# Patient Record
Sex: Female | Born: 1980
Health system: Southern US, Community
[De-identification: ages and names within clinical notes are randomized; demographics above are authoritative.]

## PROBLEM LIST (undated history)

## (undated) DIAGNOSIS — L709 Acne, unspecified: Secondary | ICD-10-CM

## (undated) DIAGNOSIS — R6882 Decreased libido: Secondary | ICD-10-CM

## (undated) HISTORY — DX: Decreased libido: R68.82

## (undated) HISTORY — PX: TUBAL LIGATION: SHX77

## (undated) HISTORY — PX: THROAT SURGERY: SHX803

## (undated) HISTORY — DX: Acne, unspecified: L70.9

---

## 2006-04-28 ENCOUNTER — Ambulatory Visit: Payer: Self-pay | Admitting: Unknown Physician Specialty

## 2010-10-15 ENCOUNTER — Ambulatory Visit: Payer: Self-pay | Admitting: Internal Medicine

## 2012-02-07 ENCOUNTER — Ambulatory Visit: Payer: Self-pay | Admitting: Emergency Medicine

## 2012-06-01 ENCOUNTER — Ambulatory Visit: Payer: Self-pay | Admitting: Obstetrics and Gynecology

## 2012-06-01 LAB — CBC
HCT: 44.1 % (ref 35.0–47.0)
HGB: 15 g/dL (ref 12.0–16.0)
MCH: 32.2 pg (ref 26.0–34.0)
MCV: 95 fL (ref 80–100)
RBC: 4.65 10*6/uL (ref 3.80–5.20)
RDW: 12.3 % (ref 11.5–14.5)

## 2012-06-01 LAB — BASIC METABOLIC PANEL
Anion Gap: 9 (ref 7–16)
BUN: 11 mg/dL (ref 7–18)
Chloride: 106 mmol/L (ref 98–107)
Creatinine: 0.66 mg/dL (ref 0.60–1.30)
EGFR (African American): >60
EGFR (Non-African Amer.): >60
Glucose: 82 mg/dL (ref 65–99)
Osmolality: 276 (ref 275–301)
Potassium: 4.1 mmol/L (ref 3.5–5.1)
Sodium: 139 mmol/L (ref 136–145)

## 2012-06-01 LAB — PREGNANCY, URINE: Pregnancy Test, Urine: NEGATIVE m[IU]/mL

## 2012-06-11 ENCOUNTER — Ambulatory Visit: Payer: Self-pay | Admitting: Obstetrics and Gynecology

## 2012-06-12 LAB — PATHOLOGY REPORT

## 2016-06-20 ENCOUNTER — Ambulatory Visit (INDEPENDENT_AMBULATORY_CARE_PROVIDER_SITE_OTHER): Payer: BLUE CROSS/BLUE SHIELD

## 2016-06-20 ENCOUNTER — Ambulatory Visit
Admission: EM | Admit: 2016-06-20 | Discharge: 2016-06-20 | Disposition: A | Discharge status: Home / Self Care | Payer: BLUE CROSS/BLUE SHIELD | Attending: Emergency Medicine | Admitting: Emergency Medicine

## 2016-06-20 ENCOUNTER — Encounter: Payer: Self-pay | Admitting: Emergency Medicine

## 2016-06-20 DIAGNOSIS — J181 Lobar pneumonia, unspecified organism: Secondary | ICD-10-CM | POA: Diagnosis not present

## 2016-06-20 DIAGNOSIS — J189 Pneumonia, unspecified organism: Secondary | ICD-10-CM

## 2016-06-20 MED ORDER — ALBUTEROL SULFATE HFA 108 (90 BASE) MCG/ACT IN AERS: 2.0000 | INHALATION_SPRAY | RESPIRATORY_TRACT | 0 refills | Status: DC | PRN

## 2016-06-20 MED ORDER — HYDROCOD POLST-CPM POLST ER 10-8 MG/5ML PO SUER: 5.0000 mL | Freq: Two times a day (BID) | ORAL | 0 refills | Status: DC

## 2016-06-20 MED ORDER — AZITHROMYCIN 500 MG PO TABS: 500.0000 mg | ORAL_TABLET | Freq: Every day | ORAL | 0 refills | Status: DC

## 2016-06-20 MED ORDER — FLUCONAZOLE 150 MG PO TABS: ORAL_TABLET | ORAL | 0 refills | Status: DC

## 2016-06-20 NOTE — ED Provider Notes (Signed)
CSN: [id number]     Arrival date & time 06/20/16  4098 History   First MD Initiated Contact with Patient 06/20/16 [phone]     Chief Complaint  Patient presents with  . Cough   (Consider location/radiation/quality/duration/timing/severity/associated sxs/prior Treatment) HPI  This a 35 year old female who presents with four-day history of cough productive of yellow green thick mucus. She is a smoker trying to quit. Been coughing most of the nighttime keeping her spouse awake. She has not had any fever or chillsExcept on the first day of symptoms when she had a fever of 102 took Tylenol and has not returned. O2 sats 100% she is afebrile today. Pulse rate 105. He denies any nasal congestion runny nose. She has had a sore throat possibly from the constant coughing.       History reviewed. No pertinent past medical history. Past Surgical History:  Procedure Laterality Date  . THROAT SURGERY     nodules  . TUBAL LIGATION     Family History  Problem Relation Age of Onset  . Diabetes Father   . Hypertension Father    Social History  Substance Use Topics  . Smoking status: Current Every Day Smoker    Packs/day: 1.00  . Smokeless tobacco: Never Used  . Alcohol use Yes   OB History    No data available     Review of Systems  Constitutional: Positive for activity change. Negative for appetite change, chills, diaphoresis, fatigue and fever.  HENT: Positive for sore throat. Negative for congestion, postnasal drip, rhinorrhea and sinus pressure.   Respiratory: Positive for cough. Negative for shortness of breath, wheezing and stridor.   All other systems reviewed and are negative.   Allergies  Review of patient's allergies indicates not on file.  Home Medications   Prior to Admission medications   Medication Sig Start Date End Date Taking? Authorizing Provider  albuterol (PROVENTIL HFA;VENTOLIN HFA) 108 (90 Base) MCG/ACT inhaler Inhale 2 puffs into the lungs every 4 (four) hours as  needed for wheezing or shortness of breath (With spacer). Use with spacer 06/20/16   Lutricia Feil, PA-C  azithromycin (ZITHROMAX) 500 MG tablet Take 1 tablet (500 mg total) by mouth daily. 06/20/16   Lutricia Feil, PA-C  chlorpheniramine-HYDROcodone (TUSSIONEX PENNKINETIC ER) 10-8 MG/5ML SUER Take 5 mLs by mouth 2 (two) times daily. 06/20/16   Lutricia Feil, PA-C  fluconazole (DIFLUCAN) 150 MG tablet Take one tab for symptoms of yeast infection. Repeat x 1 in 72 hours. 06/20/16   Lutricia Feil, PA-C   Meds Ordered and Administered this Visit  Medications - No data to display  BP 116/71   Pulse (!) 105   Temp 98 F (36.7 C) (Tympanic)   Resp 18   Ht 5\' 2"  (1.575 m)   Wt 162 lb (73.5 kg)   LMP 05/25/2016 (Exact Date) Comment: denies preg, had tubaligation  SpO2 100%   BMI 29.63 kg/m  No data found.   Physical Exam  Constitutional: She is oriented to person, place, and time. She appears well-developed and well-nourished. No distress.  HENT:  Head: Normocephalic and atraumatic.  Right Ear: External ear normal.  Left Ear: External ear normal.  Mouth/Throat: Oropharynx is clear and moist. No oropharyngeal exudate.  Eyes: EOM are normal. Pupils are equal, round, and reactive to light. Right eye exhibits no discharge. Left eye exhibits no discharge.  Neck: Normal range of motion. Neck supple.  Pulmonary/Chest: Effort normal. No respiratory distress. She has no  wheezes. She has rales. She exhibits no tenderness.  Patient has good air entry. She has non- tussive crackles in the right base. Otherwise her of auscultation is normal.  Musculoskeletal: Normal range of motion.  Lymphadenopathy:    She has no cervical adenopathy.  Neurological: She is alert and oriented to person, place, and time.  Skin: Skin is warm and dry. She is not diaphoretic.  Psychiatric: She has a normal mood and affect. Her behavior is normal. Judgment and thought content normal.  Nursing note and vitals  reviewed.   Urgent Care Course   Clinical Course    Procedures (including critical care time)  Labs Review Labs Reviewed - No data to display  Imaging Review Dg Chest 2 View  Result Date: 06/20/2016 CLINICAL DATA:  Productive cough for 5 day EXAM: CHEST  2 VIEW COMPARISON:  None. FINDINGS: Hazy airspace disease in the posterior basal segment of the right lower lobe. Lungs otherwise clear. Normal heart size. No pneumothorax. No pleural effusion. Moderate dextroscoliosis in the mid thoracic spine. IMPRESSION: Right lower lobe pneumonia. Followup PA and lateral chest X-ray is recommended in 3-4 weeks following trial of antibiotic therapy to ensure resolution and exclude underlying malignancy. Electronically Signed   By: Jolaine ClickArthur  Hoss M.D.   On: 06/20/2016 09:21     Visual Acuity Review  Right Eye Distance:   Left Eye Distance:   Bilateral Distance:    Right Eye Near:   Left Eye Near:    Bilateral Near:         MDM   1. Community acquired pneumonia of right lower lobe of lung (HCC)    New Prescriptions   ALBUTEROL (PROVENTIL HFA;VENTOLIN HFA) 108 (90 BASE) MCG/ACT INHALER    Inhale 2 puffs into the lungs every 4 (four) hours as needed for wheezing or shortness of breath (With spacer). Use with spacer   AZITHROMYCIN (ZITHROMAX) 500 MG TABLET    Take 1 tablet (500 mg total) by mouth daily.   CHLORPHENIRAMINE-HYDROCODONE (TUSSIONEX PENNKINETIC ER) 10-8 MG/5ML SUER    Take 5 mLs by mouth 2 (two) times daily.   FLUCONAZOLE (DIFLUCAN) 150 MG TABLET    Take one tab for symptoms of yeast infection. Repeat x 1 in 72 hours.  Plan: 1. Test/x-ray results and diagnosis reviewed with patient 2. rx as per orders; risks, benefits, potential side effects reviewed with patient 3. Recommend supportive treatment with Rest and fluids. Will provide her with albuterol with a spacer for bronchospasm . She will need to follow-up in 4 weeks a primary care physician. She is planning on making an  appointment through [facility]. If she is worsening has high fevers despite the use of the medication she should go immediately to the emergency room. She has today off but I will give her off Friday and Saturday with return to work Monday. 4. F/u prn if symptoms worsen or don't improve     Lutricia FeilWilliam P Mishell Donalson, PA-C 06/20/16 1002

## 2016-06-20 NOTE — ED Triage Notes (Signed)
Patient states she developed a cough over the weekend and is not getting better

## 2017-05-08 IMAGING — CR DG CHEST 2V
2 series · 2 of 2 positions shown · non-contrast
Comparison: None.

CLINICAL DATA: Productive cough for 5 day

EXAM:
CHEST  2 VIEW

[chest pa]
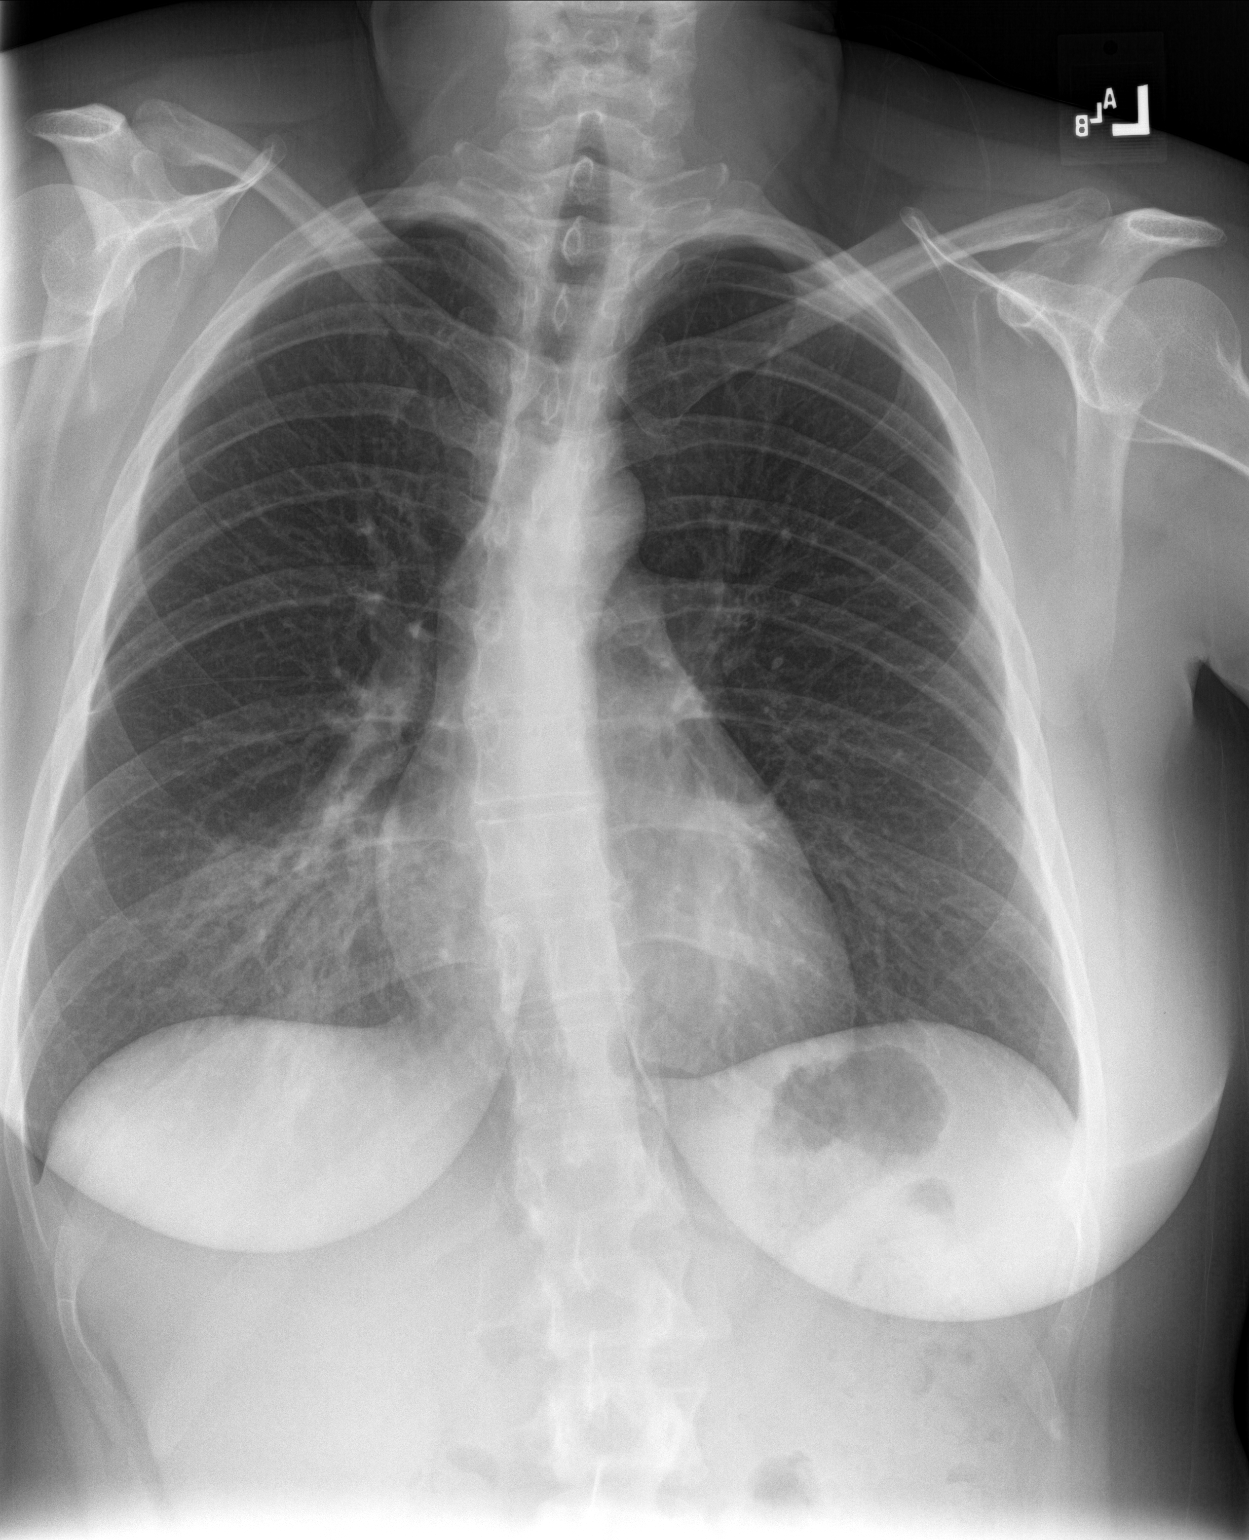

[chest lat]
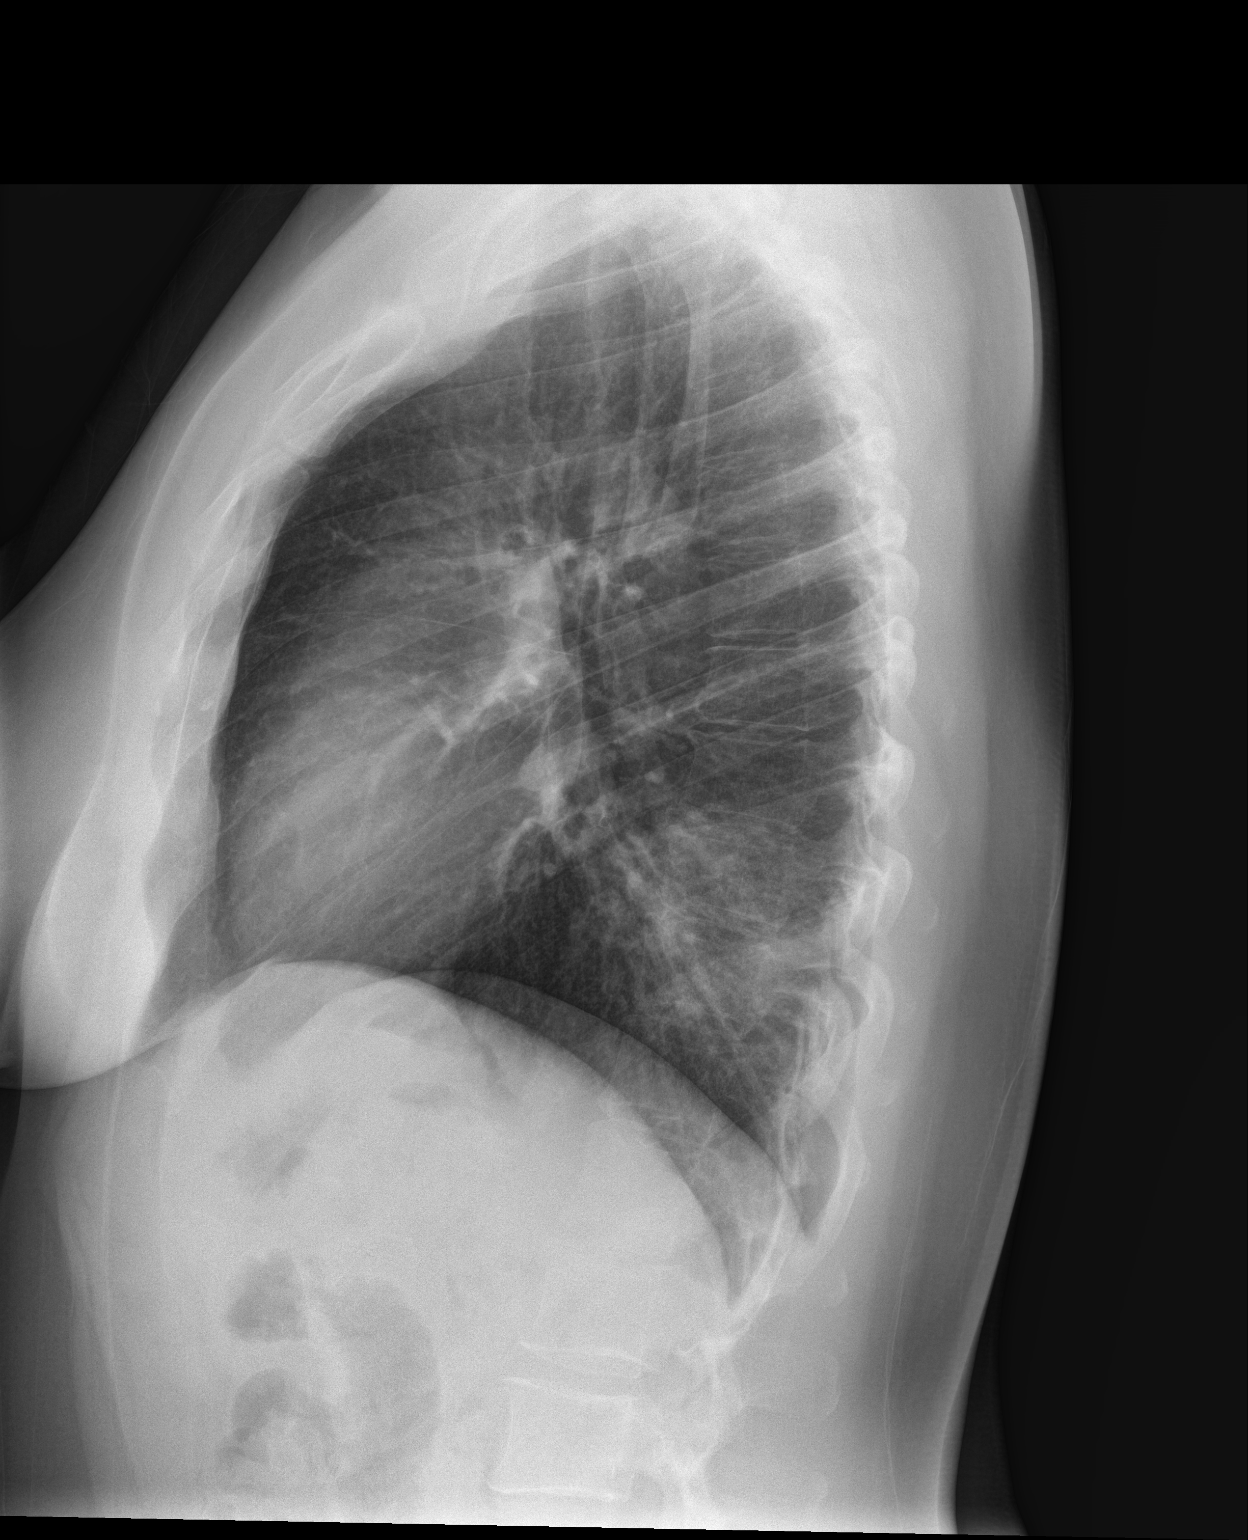

[2 of 2 positions shown; findings below may reference images not displayed]

FINDINGS: Hazy airspace disease in the posterior basal segment of the right
lower lobe. Lungs otherwise clear. Normal heart size. No
pneumothorax. No pleural effusion. Moderate dextroscoliosis in the
mid thoracic spine.
IMPRESSION: Right lower lobe pneumonia. Followup PA and lateral chest X-ray is
recommended in 3-4 weeks following trial of antibiotic therapy to
ensure resolution and exclude underlying malignancy.

## 2020-05-29 NOTE — Patient Instructions (Signed)
I value your feedback and entrusting us with your care. If you get a [facility] patient survey, I would appreciate you taking the time to let us know about your experience today. Thank you!  As of August 19, 2019, your lab results will be released to your MyChart immediately, before I even have a chance to see them. Please give me time to review them and contact you if there are any abnormalities. Thank you for your patience.  

## 2020-05-29 NOTE — Progress Notes (Signed)
PCP:  Patient, No Pcp Per   Chief Complaint  Patient presents with  . Gynecologic Exam     HPI:      Penny Smith is a 39 y.o. No obstetric history on file. whose LMP was Patient's last menstrual period was 05/15/2020., presents today for her NP> 3 yrs annual examination.  Her menses are regular every 28-30 days, lasting 4-5 days.  Dysmenorrhea none. She does not have intermenstrual bleeding.  Sex activity: single partner, contraception - tubal ligation.  Last Pap: 03/28/12  Results were: no abnormalities /neg HPV DNA  Hx of STDs: none  There is no FH of breast cancer. There is no FH of ovarian cancer. The patient does do self-breast exams.  Tobacco use: <1 ppd, plans to quit by age 63 Alcohol use: none No drug use.  Exercise: moderately active Has lost 15# this yr, wants to lose a few more.  She does get adequate calcium but not Vitamin D in her diet. No recent labs.  Having issues with adult acne. Saw derm and was given oral abx, topical cream and OTC adapalene. Pt hasn't noticed improvement. Was told next step is accutane which she doesn't want to take. Sx worsened after stopping OCPs yrs ago. Has cystic acne with scarring.  Using cetaphil wash, toner, and cerave face crm.   Past Medical History:  Diagnosis Date  . Acne   . Decreased libido     Past Surgical History:  Procedure Laterality Date  . THROAT SURGERY     nodules  . TUBAL LIGATION      Family History  Problem Relation Age of Onset  . Diabetes Father   . Hypertension Father   . Hypercholesterolemia Father   . Hypertension Mother   . Endometriosis Sister        1/2    Social History   Socioeconomic History  . Marital status: Married    Spouse name: Not on file  . Number of children: Not on file  . Years of education: Not on file  . Highest education level: Not on file  Occupational History  . Not on file  Tobacco Use  . Smoking status: Current Every Day Smoker    Packs/day: 1.00    . Smokeless tobacco: Never Used  Vaping Use  . Vaping Use: Never used  Substance and Sexual Activity  . Alcohol use: Yes  . Drug use: Never  . Sexual activity: Yes    Birth control/protection: Surgical    Comment: Tubal Ligation  Other Topics Concern  . Not on file  Social History Narrative  . Not on file   Social Determinants of Health   Financial Resource Strain:   . Difficulty of Paying Living Expenses: Not on file  Food Insecurity:   . Worried About Programme researcher, broadcasting/film/video in the Last Year: Not on file  . Ran Out of Food in the Last Year: Not on file  Transportation Needs:   . Lack of Transportation (Medical): Not on file  . Lack of Transportation (Non-Medical): Not on file  Physical Activity:   . Days of Exercise per Week: Not on file  . Minutes of Exercise per Session: Not on file  Stress:   . Feeling of Stress : Not on file  Social Connections:   . Frequency of Communication with Friends and Family: Not on file  . Frequency of Social Gatherings with Friends and Family: Not on file  . Attends Religious Services: Not on  file  . Active Member of Clubs or Organizations: Not on file  . Attends Banker Meetings: Not on file  . Marital Status: Not on file  Intimate Partner Violence:   . Fear of Current or Ex-Partner: Not on file  . Emotionally Abused: Not on file  . Physically Abused: Not on file  . Sexually Abused: Not on file     Current Outpatient Medications:  .  Adapalene 0.3 % gel, Apply pea sized amount QHS, Disp: 45 g, Rfl: 1 .  spironolactone (ALDACTONE) 50 MG tablet, Take 1 tablet (50 mg total) by mouth daily., Disp: 30 tablet, Rfl: 2     ROS:  Review of Systems  Constitutional: Negative for fatigue, fever and unexpected weight change.  Respiratory: Negative for cough, shortness of breath and wheezing.   Cardiovascular: Negative for chest pain, palpitations and leg swelling.  Gastrointestinal: Negative for blood in stool, constipation,  diarrhea, nausea and vomiting.  Endocrine: Negative for cold intolerance, heat intolerance and polyuria.  Genitourinary: Negative for dyspareunia, dysuria, flank pain, frequency, genital sores, hematuria, menstrual problem, pelvic pain, urgency, vaginal bleeding, vaginal discharge and vaginal pain.  Musculoskeletal: Negative for back pain, joint swelling and myalgias.  Skin: Negative for rash.  Neurological: Negative for dizziness, syncope, light-headedness, numbness and headaches.  Hematological: Negative for adenopathy.  Psychiatric/Behavioral: Negative for agitation, confusion, sleep disturbance and suicidal ideas. The patient is not nervous/anxious.    BREAST: No symptoms   Objective: BP 120/90   Ht 5\' 2"  (1.575 m)   Wt 146 lb (66.2 kg)   LMP 05/15/2020   BMI 26.70 kg/m    Physical Exam Constitutional:      Appearance: She is well-developed.  Genitourinary:     Vulva, vagina, cervix, uterus, right adnexa and left adnexa normal.     No vulval lesion or tenderness noted.     No vaginal discharge, erythema or tenderness.     No cervical polyp.     Uterus is not enlarged or tender.     No right or left adnexal mass present.     Right adnexa not tender.     Left adnexa not tender.  Neck:     Thyroid: No thyromegaly.  Cardiovascular:     Rate and Rhythm: Normal rate and regular rhythm.     Heart sounds: Normal heart sounds. No murmur heard.   Pulmonary:     Effort: Pulmonary effort is normal.     Breath sounds: Normal breath sounds.  Chest:     Breasts:        Right: No mass, nipple discharge, skin change or tenderness.        Left: No mass, nipple discharge, skin change or tenderness.  Abdominal:     Palpations: Abdomen is soft.     Tenderness: There is no abdominal tenderness. There is no guarding.  Musculoskeletal:        General: Normal range of motion.     Cervical back: Normal range of motion.  Neurological:     General: No focal deficit present.     Mental  Status: She is alert and oriented to person, place, and time.     Cranial Nerves: No cranial nerve deficit.  Skin:    General: Skin is warm and dry.     Comments: CYSTIC ACNE LESIONS ON CHEEKS AND CHIN WITH SOME SCARRING  Psychiatric:        Mood and Affect: Mood normal.  Behavior: Behavior normal.        Thought Content: Thought content normal.        Judgment: Judgment normal.  Vitals reviewed.    Assessment/Plan: Encounter for annual routine gynecological examination  Cervical cancer screening - Plan: Cytology - PAP  Screening for HPV (human papillomavirus) - Plan: Cytology - PAP  Acne vulgaris - Plan: spironolactone (ALDACTONE) 50 MG tablet, Adapalene 0.3 % gel; discussed trying spironolactone since had relief with OCPs. Do Rx adapalene, can f/u with derm again prn. Make sure to use non-comedogenic products.  Blood tests for routine general physical examination - Plan: Comprehensive metabolic panel, Lipid panel  Screening cholesterol level - Plan: Lipid panel  Meds ordered this encounter  Medications  . spironolactone (ALDACTONE) 50 MG tablet    Sig: Take 1 tablet (50 mg total) by mouth daily.    Dispense:  30 tablet    Refill:  2    Order Specific Question:   Supervising Provider    Answer:   Nadara Mustard [id number]  . Adapalene 0.3 % gel    Sig: Apply pea sized amount QHS    Dispense:  45 g    Refill:  1    Order Specific Question:   Supervising Provider    Answer:   Nadara Mustard [[id number]]             GYN counsel adequate intake of calcium and vitamin D, diet and exercise     F/U  Return in about 1 year (around 05/30/2021).  Lynsi Dooner B. Cledith Abdou, PA-C 05/30/2020 10:38 AM

## 2020-05-30 ENCOUNTER — Other Ambulatory Visit: Payer: Self-pay

## 2020-05-30 ENCOUNTER — Encounter: Payer: Self-pay | Admitting: Obstetrics and Gynecology

## 2020-05-30 ENCOUNTER — Ambulatory Visit (INDEPENDENT_AMBULATORY_CARE_PROVIDER_SITE_OTHER): Payer: BC Managed Care – PPO | Admitting: Obstetrics and Gynecology

## 2020-05-30 ENCOUNTER — Other Ambulatory Visit (HOSPITAL_COMMUNITY)
Admission: RE | Admit: 2020-05-30 | Discharge: 2020-05-30 | Disposition: A | Discharge status: Home / Self Care | Payer: BC Managed Care – PPO | Source: Ambulatory Visit | Attending: Obstetrics and Gynecology | Admitting: Obstetrics and Gynecology

## 2020-05-30 VITALS — BP 120/90 | Ht 62.0 in | Wt 146.0 lb

## 2020-05-30 DIAGNOSIS — Z01419 Encounter for gynecological examination (general) (routine) without abnormal findings: Secondary | ICD-10-CM | POA: Diagnosis not present

## 2020-05-30 DIAGNOSIS — Z124 Encounter for screening for malignant neoplasm of cervix: Secondary | ICD-10-CM | POA: Insufficient documentation

## 2020-05-30 DIAGNOSIS — Z1151 Encounter for screening for human papillomavirus (HPV): Secondary | ICD-10-CM | POA: Diagnosis not present

## 2020-05-30 DIAGNOSIS — Z13220 Encounter for screening for lipoid disorders: Secondary | ICD-10-CM

## 2020-05-30 DIAGNOSIS — E782 Mixed hyperlipidemia: Secondary | ICD-10-CM

## 2020-05-30 DIAGNOSIS — Z0000 Encounter for general adult medical examination without abnormal findings: Secondary | ICD-10-CM | POA: Diagnosis not present

## 2020-05-30 DIAGNOSIS — L700 Acne vulgaris: Secondary | ICD-10-CM | POA: Diagnosis not present

## 2020-05-30 MED ORDER — SPIRONOLACTONE 50 MG PO TABS: 50.0000 mg | ORAL_TABLET | Freq: Every day | ORAL | 2 refills | Status: DC

## 2020-05-30 MED ORDER — ADAPALENE 0.3 % EX GEL: CUTANEOUS | 1 refills | Status: DC

## 2020-05-31 DIAGNOSIS — E782 Mixed hyperlipidemia: Secondary | ICD-10-CM | POA: Insufficient documentation

## 2020-05-31 LAB — COMPREHENSIVE METABOLIC PANEL
ALT: 17 IU/L (ref 0–32)
AST: 17 IU/L (ref 0–40)
Albumin/Globulin Ratio: 1.8 (ref 1.2–2.2)
Albumin: 4.6 g/dL (ref 3.8–4.8)
Alkaline Phosphatase: 83 IU/L (ref 44–121)
BUN/Creatinine Ratio: 15 (ref 9–23)
BUN: 13 mg/dL (ref 6–20)
Bilirubin Total: 0.2 mg/dL (ref 0.0–1.2)
CO2: 23 mmol/L (ref 20–29)
Calcium: 9.3 mg/dL (ref 8.7–10.2)
Chloride: 102 mmol/L (ref 96–106)
Creatinine, Ser: 0.86 mg/dL (ref 0.57–1.00)
GFR calc Af Amer: 99 mL/min/{1.73_m2} (ref >59)
GFR calc non Af Amer: 86 mL/min/{1.73_m2} (ref >59)
Globulin, Total: 2.6 g/dL (ref 1.5–4.5)
Glucose: 94 mg/dL (ref 65–99)
Potassium: 4.6 mmol/L (ref 3.5–5.2)
Sodium: 138 mmol/L (ref 134–144)
Total Protein: 7.2 g/dL (ref 6.0–8.5)

## 2020-05-31 LAB — LIPID PANEL
Chol/HDL Ratio: 6.9 ratio — ABNORMAL HIGH (ref 0.0–4.4)
Cholesterol, Total: 268 mg/dL — ABNORMAL HIGH (ref 100–199)
HDL: 39 mg/dL — ABNORMAL LOW (ref >39)
LDL Chol Calc (NIH): 184 mg/dL — ABNORMAL HIGH (ref 0–99)
Triglycerides: 236 mg/dL — ABNORMAL HIGH (ref 0–149)
VLDL Cholesterol Cal: 45 mg/dL — ABNORMAL HIGH (ref 5–40)

## 2020-05-31 NOTE — Addendum Note (Signed)
Addended by: Althea Grimmer B on: 05/31/2020 09:19 AM   Modules accepted: Orders

## 2020-06-01 LAB — CYTOLOGY - PAP
Comment: NEGATIVE
Diagnosis: NEGATIVE
High risk HPV: NEGATIVE

## 2020-08-16 ENCOUNTER — Other Ambulatory Visit: Payer: Self-pay

## 2020-08-16 ENCOUNTER — Ambulatory Visit
Admission: EM | Admit: 2020-08-16 | Discharge: 2020-08-16 | Disposition: A | Discharge status: Home / Self Care | Payer: BC Managed Care – PPO | Attending: Family Medicine | Admitting: Family Medicine

## 2020-08-16 ENCOUNTER — Encounter: Payer: Self-pay | Admitting: Emergency Medicine

## 2020-08-16 DIAGNOSIS — J029 Acute pharyngitis, unspecified: Secondary | ICD-10-CM | POA: Diagnosis not present

## 2020-08-16 LAB — GROUP A STREP BY PCR: Group A Strep by PCR: NOT DETECTED

## 2020-08-16 NOTE — Discharge Instructions (Signed)
I will call with the results.  Tylenol and ibuprofen as needed.  Warm salt water gargles.  Take care  Dr. Adriana Simas

## 2020-08-16 NOTE — ED Provider Notes (Signed)
[facility]    CSN: [id number] Arrival date & time: 08/16/20  0801      History   Chief Complaint Chief Complaint  Patient presents with  . Sore Throat   HPI  39 year old female presents with sore throat.  Sore throat began last night.  Mild in severity.  She states that it is slightly improved this morning.  Primarily affects the right side of her throat.  No documented fever.  No other associated symptoms.  She is concerned about the possibility of strep throat.  No other complaints or concerns at this time.  Past Medical History:  Diagnosis Date  . Acne   . Decreased libido     Patient Active Problem List   Diagnosis Date Noted  . Mixed hyperlipidemia 05/31/2020    Past Surgical History:  Procedure Laterality Date  . THROAT SURGERY     nodules  . TUBAL LIGATION      OB History    Gravida  0   Para  0   Term  0   Preterm  0   AB  0   Living  0     SAB  0   TAB  0   Ectopic  0   Multiple  0   Live Births  0            Home Medications    Prior to Admission medications   Medication Sig Start Date End Date Taking? Authorizing Provider  Adapalene 0.3 % gel Apply pea sized amount QHS 05/30/20  Yes Copland, Alicia B, PA-C  spironolactone (ALDACTONE) 50 MG tablet Take 1 tablet (50 mg total) by mouth daily. 05/30/20  Yes Copland, Ilona Sorrel, PA-C    Family History Family History  Problem Relation Age of Onset  . Diabetes Father   . Hypertension Father   . Hypercholesterolemia Father   . Hypertension Mother   . Endometriosis Sister        1/2    Social History Social History   Tobacco Use  . Smoking status: Current Every Day Smoker    Packs/day: 1.00  . Smokeless tobacco: Never Used  Vaping Use  . Vaping Use: Never used  Substance Use Topics  . Alcohol use: Yes  . Drug use: Never     Allergies   Patient has no known allergies.   Review of Systems Review of Systems  Constitutional: Negative.   HENT: Positive  for sore throat.    Physical Exam Triage Vital Signs ED Triage Vitals  Enc Vitals Group     BP 08/16/20 0814 (!) 146/90     Pulse Rate 08/16/20 0814 100     Resp 08/16/20 0814 18     Temp 08/16/20 0814 98.7 F (37.1 C)     Temp Source 08/16/20 0814 Oral     SpO2 08/16/20 0814 100 %     Weight 08/16/20 0812 153 lb (69.4 kg)     Height 08/16/20 0812 5\' 2"  (1.575 m)     Head Circumference --      Peak Flow --      Pain Score 08/16/20 0812 3     Pain Loc --      Pain Edu? --      Excl. in GC? --    Updated Vital Signs BP (!) 146/90 (BP Location: Left Arm)   Pulse 100   Temp 98.7 F (37.1 C) (Oral)   Resp 18   Ht 5\' 2"  (1.575 m)  Wt 69.4 kg   LMP 08/09/2020   SpO2 100%   BMI 27.98 kg/m   Visual Acuity Right Eye Distance:   Left Eye Distance:   Bilateral Distance:    Right Eye Near:   Left Eye Near:    Bilateral Near:     Physical Exam Vitals and nursing note reviewed.  Constitutional:      General: She is not in acute distress.    Appearance: Normal appearance. She is not ill-appearing.  HENT:     Head: Normocephalic and atraumatic.     Mouth/Throat:     Pharynx: Posterior oropharyngeal erythema present. No oropharyngeal exudate.  Cardiovascular:     Rate and Rhythm: Normal rate and regular rhythm.     Heart sounds: No murmur heard.   Pulmonary:     Effort: Pulmonary effort is normal.     Breath sounds: Normal breath sounds. No wheezing, rhonchi or rales.  Neurological:     Mental Status: She is alert.  Psychiatric:        Mood and Affect: Mood normal.        Behavior: Behavior normal.    UC Treatments / Results  Labs (all labs ordered are listed, but only abnormal results are displayed) Labs Reviewed  GROUP A STREP BY PCR    EKG   Radiology No results found.  Procedures Procedures (including critical care time)  Medications Ordered in UC Medications - No data to display  Initial Impression / Assessment and Plan / UC Course  I have  reviewed the triage vital signs and the nursing notes.  Pertinent labs & imaging results that were available during my care of the patient were reviewed by me and considered in my medical decision making (see chart for details).    39 year old female presents with pharyngitis.  This is likely viral in origin.  Strep negative today.  Advised over-the-counter Tylenol ibuprofen.  Warm salt water gargles.  Supportive care.  Final Clinical Impressions(s) / UC Diagnoses   Final diagnoses:  Pharyngitis, unspecified etiology     Discharge Instructions     I will call with the results.  Tylenol and ibuprofen as needed.  Warm salt water gargles.  Take care  Dr. Adriana Simas    ED Prescriptions    None     PDMP not reviewed this encounter.   Everlene Other [place], [place] 08/16/20 [phone]

## 2020-08-16 NOTE — ED Triage Notes (Signed)
Patient c/o sore throat that started last night. Denies any other symptoms.  

## 2021-04-24 DIAGNOSIS — M25512 Pain in left shoulder: Secondary | ICD-10-CM | POA: Diagnosis not present

## 2021-04-24 DIAGNOSIS — S46912A Strain of unspecified muscle, fascia and tendon at shoulder and upper arm level, left arm, initial encounter: Secondary | ICD-10-CM | POA: Diagnosis not present

## 2024-01-28 NOTE — Progress Notes (Signed)
 PCP:  Patient, No Pcp Per   Chief Complaint  Patient presents with   Gynecologic Exam    No concerns     HPI:      Penny Smith is a 43 y.o. No obstetric history on file. whose LMP was Patient's last menstrual period was 01/12/2024 (approximate)., presents today for her NP> 3 yrs annual examination.  Her menses are regular every 28-30 days, lasting 5 days, light to mod flow.  Dysmenorrhea none. She does not have intermenstrual bleeding.  Sex activity: single partner, contraception - tubal ligation. No pain/bleeding/dryness.  Last Pap: 05/30/20 Results were: no abnormalities /neg HPV DNA  Hx of STDs: none  Last mamm: never FH unknown. The patient does do self-breast exams.  Tobacco use: 1/2-3/4 ppd, trying to cut down Alcohol use: wkly No drug use.  Exercise: rare active  She does get some calcium and adequate Vitamin D in her diet. Elevated lipids 9/21; due for repeat labs.    Past Medical History:  Diagnosis Date   Acne    Decreased libido     Past Surgical History:  Procedure Laterality Date   THROAT SURGERY     nodules   TUBAL LIGATION      Family History  Problem Relation Age of Onset   Diabetes Father    Hypertension Father    Hypercholesterolemia Father    Hypertension Mother    Endometriosis Sister        1/2    Social History   Socioeconomic History   Marital status: Married    Spouse name: Not on file   Number of children: Not on file   Years of education: Not on file   Highest education level: Not on file  Occupational History   Not on file  Tobacco Use   Smoking status: Every Day    Current packs/day: 1.00    Types: Cigarettes   Smokeless tobacco: Never  Vaping Use   Vaping status: Never Used  Substance and Sexual Activity   Alcohol use: Yes   Drug use: Never   Sexual activity: Yes    Birth control/protection: Surgical    Comment: Tubal Ligation  Other Topics Concern   Not on file  Social History Narrative   Not on  file   Social Drivers of Health   Financial Resource Strain: Not on file  Food Insecurity: Not on file  Transportation Needs: Not on file  Physical Activity: Not on file  Stress: Not on file  Social Connections: Not on file  Intimate Partner Violence: Not on file    No current outpatient medications on file.     ROS:  Review of Systems  Constitutional:  Negative for fatigue, fever and unexpected weight change.  Respiratory:  Negative for cough, shortness of breath and wheezing.   Cardiovascular:  Negative for chest pain, palpitations and leg swelling.  Gastrointestinal:  Negative for blood in stool, constipation, diarrhea, nausea and vomiting.  Endocrine: Negative for cold intolerance, heat intolerance and polyuria.  Genitourinary:  Negative for dyspareunia, dysuria, flank pain, frequency, genital sores, hematuria, menstrual problem, pelvic pain, urgency, vaginal bleeding, vaginal discharge and vaginal pain.  Musculoskeletal:  Negative for back pain, joint swelling and myalgias.  Skin:  Negative for rash.  Neurological:  Negative for dizziness, syncope, light-headedness, numbness and headaches.  Hematological:  Negative for adenopathy.  Psychiatric/Behavioral:  Negative for agitation, confusion, sleep disturbance and suicidal ideas. The patient is not nervous/anxious.    BREAST: No symptoms  Objective: BP 127/84   Pulse 94   Ht 5\' 2"  (1.575 m)   Wt 162 lb (73.5 kg)   LMP 01/12/2024 (Approximate)   BMI 29.63 kg/m    Physical Exam Constitutional:      Appearance: She is well-developed.  Genitourinary:     Vulva normal.     Right Labia: No rash, tenderness or lesions.    Left Labia: No tenderness, lesions or rash.    No vaginal discharge, erythema or tenderness.      Right Adnexa: not tender and no mass present.    Left Adnexa: not tender and no mass present.    No cervical friability or polyp.     Uterus is not enlarged or tender.  Breasts:    Right: No  mass, nipple discharge, skin change or tenderness.     Left: No mass, nipple discharge, skin change or tenderness.  Neck:     Thyroid: No thyromegaly.  Cardiovascular:     Rate and Rhythm: Normal rate and regular rhythm.     Heart sounds: Normal heart sounds. No murmur heard. Pulmonary:     Effort: Pulmonary effort is normal.     Breath sounds: Normal breath sounds.  Abdominal:     Palpations: Abdomen is soft.     Tenderness: There is no abdominal tenderness. There is no guarding or rebound.  Musculoskeletal:        General: Normal range of motion.     Cervical back: Normal range of motion.  Lymphadenopathy:     Cervical: No cervical adenopathy.  Neurological:     General: No focal deficit present.     Mental Status: She is alert and oriented to person, place, and time.     Cranial Nerves: No cranial nerve deficit.  Skin:    General: Skin is warm and dry.  Psychiatric:        Mood and Affect: Mood normal.        Behavior: Behavior normal.        Thought Content: Thought content normal.        Judgment: Judgment normal.  Vitals reviewed.    Assessment/Plan: Encounter for annual routine gynecological examination  Cervical cancer screening - Plan: Cytology - PAP  Screening for HPV (human papillomavirus) - Plan: Cytology - PAP  Encounter for screening mammogram for malignant neoplasm of breast - Plan: MM 3D SCREENING MAMMOGRAM BILATERAL BREAST; pt to schedule mammo  Mixed hyperlipidemia - Plan: Lipid panel  Blood tests for routine general physical examination - Plan: Comprehensive metabolic panel with GFR, Lipid panel, Hemoglobin A1c  Screening for diabetes mellitus - Plan: Hemoglobin A1c   No orders of the defined types were placed in this encounter.            GYN counsel adequate intake of calcium and vitamin D, diet and exercise     F/U  Return in about 1 year (around 01/28/2025).  Melessa Cowell B. Blase Beckner, PA-C 01/29/2024 10:31 AM

## 2024-01-29 ENCOUNTER — Ambulatory Visit (INDEPENDENT_AMBULATORY_CARE_PROVIDER_SITE_OTHER): Admitting: Obstetrics and Gynecology

## 2024-01-29 ENCOUNTER — Encounter: Payer: Self-pay | Admitting: Obstetrics and Gynecology

## 2024-01-29 ENCOUNTER — Other Ambulatory Visit (HOSPITAL_COMMUNITY)
Admission: RE | Admit: 2024-01-29 | Discharge: 2024-01-29 | Disposition: A | Discharge status: Home / Self Care | Source: Ambulatory Visit | Attending: Obstetrics and Gynecology | Admitting: Obstetrics and Gynecology

## 2024-01-29 VITALS — BP 127/84 | HR 94 | Ht 62.0 in | Wt 162.0 lb

## 2024-01-29 DIAGNOSIS — Z1151 Encounter for screening for human papillomavirus (HPV): Secondary | ICD-10-CM | POA: Insufficient documentation

## 2024-01-29 DIAGNOSIS — Z01419 Encounter for gynecological examination (general) (routine) without abnormal findings: Secondary | ICD-10-CM

## 2024-01-29 DIAGNOSIS — Z1231 Encounter for screening mammogram for malignant neoplasm of breast: Secondary | ICD-10-CM

## 2024-01-29 DIAGNOSIS — Z124 Encounter for screening for malignant neoplasm of cervix: Secondary | ICD-10-CM | POA: Diagnosis present

## 2024-01-29 DIAGNOSIS — Z0000 Encounter for general adult medical examination without abnormal findings: Secondary | ICD-10-CM

## 2024-01-29 DIAGNOSIS — Z131 Encounter for screening for diabetes mellitus: Secondary | ICD-10-CM

## 2024-01-29 DIAGNOSIS — E782 Mixed hyperlipidemia: Secondary | ICD-10-CM

## 2024-01-29 NOTE — Patient Instructions (Addendum)
 I value your feedback and you entrusting [place] with your care. If you get a [facility] patient survey, I would appreciate you taking the time to let us know about your experience today. Thank you!  [facility] ([place])--[phone]

## 2024-02-04 LAB — CYTOLOGY - PAP
Comment: NEGATIVE
Diagnosis: NEGATIVE
Diagnosis: REACTIVE
High risk HPV: NEGATIVE
